# Patient Record
Sex: Male | Born: 1995 | Race: Black or African American | Hispanic: No | Marital: Single | Smoking: Never smoker
Health system: Southern US, Community
[De-identification: ages and names within clinical notes are randomized; demographics above are authoritative.]

---

## 2017-02-02 ENCOUNTER — Emergency Department (HOSPITAL_COMMUNITY): Payer: BLUE CROSS/BLUE SHIELD

## 2017-02-02 ENCOUNTER — Other Ambulatory Visit: Payer: Self-pay

## 2017-02-02 ENCOUNTER — Emergency Department (HOSPITAL_COMMUNITY)
Admission: EM | Admit: 2017-02-02 | Discharge: 2017-02-02 | Disposition: A | Discharge status: Home / Self Care | Payer: BLUE CROSS/BLUE SHIELD | Attending: Emergency Medicine | Admitting: Emergency Medicine

## 2017-02-02 ENCOUNTER — Encounter (HOSPITAL_COMMUNITY): Payer: Self-pay | Admitting: *Deleted

## 2017-02-02 DIAGNOSIS — Y92410 Unspecified street and highway as the place of occurrence of the external cause: Secondary | ICD-10-CM | POA: Diagnosis not present

## 2017-02-02 DIAGNOSIS — X19XXXA Contact with other heat and hot substances, initial encounter: Secondary | ICD-10-CM | POA: Insufficient documentation

## 2017-02-02 DIAGNOSIS — M25532 Pain in left wrist: Secondary | ICD-10-CM

## 2017-02-02 DIAGNOSIS — Y998 Other external cause status: Secondary | ICD-10-CM | POA: Insufficient documentation

## 2017-02-02 DIAGNOSIS — T300 Burn of unspecified body region, unspecified degree: Secondary | ICD-10-CM

## 2017-02-02 DIAGNOSIS — T23072A Burn of unspecified degree of left wrist, initial encounter: Secondary | ICD-10-CM | POA: Insufficient documentation

## 2017-02-02 DIAGNOSIS — Y9389 Activity, other specified: Secondary | ICD-10-CM | POA: Insufficient documentation

## 2017-02-02 MED ORDER — METHOCARBAMOL 500 MG PO TABS: 500.0000 mg | ORAL_TABLET | Freq: Two times a day (BID) | ORAL | 0 refills | Status: AC

## 2017-02-02 MED ORDER — NAPROXEN 500 MG PO TABS: 500.0000 mg | ORAL_TABLET | Freq: Two times a day (BID) | ORAL | 0 refills | Status: AC

## 2017-02-02 MED ORDER — IBUPROFEN 200 MG PO TABS
600.0000 mg | ORAL_TABLET | Freq: Once | ORAL | Status: AC
Start: 2017-02-02 — End: 2017-02-02
  Administered 2017-02-02: 600 mg via ORAL
  Filled 2017-02-02: qty 1
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
## 2017-02-02 NOTE — Discharge Instructions (Signed)
You will likely experience some muscle aches and pains tomorrow, you may take Naprosyn and Robaxin as needed for pain management. Do not combine with any pain reliever other than tylenol. The muscle soreness should improve over the next week.  Please apply Neosporin ointment to the burn on your left wrist, you may apply ice as needed.  Follow up with your family doctor in the next week for a recheck if you are still having symptoms. Return to ED if pain is worsening, you develop weakness or numbness of extremities, or increasing redness, swelling or pain around the left wrist or other new or concerning symptoms develop.

## 2017-02-02 NOTE — ED Notes (Signed)
Declined W/C at D/C and was escorted to lobby by RN. 

## 2017-02-02 NOTE — ED Notes (Signed)
Patient transported to X-ray 

## 2017-10-20 ENCOUNTER — Encounter (HOSPITAL_COMMUNITY): Payer: Self-pay | Admitting: Emergency Medicine

## 2017-10-20 ENCOUNTER — Emergency Department (HOSPITAL_COMMUNITY)
Admission: EM | Admit: 2017-10-20 | Discharge: 2017-10-20 | Disposition: A | Discharge status: Home / Self Care | Payer: BLUE CROSS/BLUE SHIELD | Attending: Emergency Medicine | Admitting: Emergency Medicine

## 2017-10-20 ENCOUNTER — Emergency Department (HOSPITAL_COMMUNITY): Payer: BLUE CROSS/BLUE SHIELD

## 2017-10-20 ENCOUNTER — Other Ambulatory Visit: Payer: Self-pay

## 2017-10-20 DIAGNOSIS — Y929 Unspecified place or not applicable: Secondary | ICD-10-CM | POA: Insufficient documentation

## 2017-10-20 DIAGNOSIS — X58XXXA Exposure to other specified factors, initial encounter: Secondary | ICD-10-CM | POA: Diagnosis not present

## 2017-10-20 DIAGNOSIS — F1290 Cannabis use, unspecified, uncomplicated: Secondary | ICD-10-CM | POA: Insufficient documentation

## 2017-10-20 DIAGNOSIS — S43004A Unspecified dislocation of right shoulder joint, initial encounter: Secondary | ICD-10-CM | POA: Diagnosis not present

## 2017-10-20 DIAGNOSIS — Y998 Other external cause status: Secondary | ICD-10-CM | POA: Diagnosis not present

## 2017-10-20 DIAGNOSIS — Y9389 Activity, other specified: Secondary | ICD-10-CM | POA: Insufficient documentation

## 2017-10-20 MED ORDER — PROPOFOL 10 MG/ML IV BOLUS: 0.5000 mg/kg | Freq: Once | INTRAVENOUS | Status: DC

## 2017-10-20 MED ORDER — PROPOFOL 10 MG/ML IV BOLUS
INTRAVENOUS | Status: AC | PRN
  Administered 2017-10-20: 37.4 mg via INTRAVENOUS
  Administered 2017-10-20: 74.8 mg via INTRAVENOUS
  Administered 2017-10-20: 37.4 mg via INTRAVENOUS

## 2017-10-20 MED ORDER — MORPHINE SULFATE (PF) 4 MG/ML IV SOLN: 4.0000 mg | Freq: Once | INTRAVENOUS | Status: DC

## 2017-10-20 MED ORDER — PROPOFOL 10 MG/ML IV BOLUS
INTRAVENOUS | Status: AC
  Filled 2017-10-20: qty 20

## 2017-10-20 NOTE — ED Notes (Signed)
Pt requesting DC. PA Jeraldine Loots made aware of pt's readiness for DC. Awaiting paperwork at this time

## 2017-10-20 NOTE — ED Triage Notes (Signed)
Patient reports right shoulder joint dislocation this evening , denies injury or fall , pt. stated it happened in the past with no injury .

## 2017-10-20 NOTE — ED Notes (Signed)
RRT at bedside for the procedure of conscious sedation for Right shoulder reduction. No complications noted. Patient tolerated procedure well. No respiratory compromise or adverse effects noted.

## 2017-10-20 NOTE — Discharge Instructions (Addendum)
Please take Ibuprofen (Advil, motrin) and Tylenol (acetaminophen) to relieve your pain.  You may take up to 600 MG (3 pills) of normal strength ibuprofen every 8 hours as needed.  In between doses of ibuprofen you make take tylenol, up to 1,000 mg (two extra strength pills).  Do not take more than 3,000 mg tylenol in a 24 hour period.  Please check all medication labels as many medications such as pain and cold medications may contain tylenol.  Do not drink alcohol while taking these medications.  Do not take other NSAID'S while taking ibuprofen (such as aleve or naproxen).  Please take ibuprofen with food to decrease stomach upset.  Your shoulder appears to be chronically unstable, meaning that it will continue to frequently dislocate.  I have given you follow-up with orthopedics as you most likely need surgery to keep your shoulder from recurrently dislocating.

## 2017-10-20 NOTE — ED Provider Notes (Signed)
[facility] EMERGENCY DEPARTMENT Provider Note   CSN: [id number] Arrival date & time: 10/20/17  0008     History   Chief Complaint Chief Complaint  Patient presents with  . Dislocation    R-shoulder    HPI Cody Duran is a 22 y.o. male who presents today for evaluation of recurrent right shoulder dislocation.  He reports that he was "wrestling" with a friend who is in the room and assist in providing history.  He denies striking his head or passing out.  He states that he has dislocated the shoulder 9 or 10 times, however has always been able to pop it back in on his own at home.  He says that he has been unable to do this today.  He thinks that it popped out at around midnight.  He denies any numbness or tingling in his right arm.  He is right-handed.  His last meal was at 1 PM, his last oral intake including water was approximately 10pm.   Patient admits to marijuana use, denies any other drugs or alcohol ingestion tonight.  HPI  History reviewed. No pertinent past medical history.  There are no active problems to display for this patient.   History reviewed. No pertinent surgical history.      Home Medications    Prior to Admission medications   Medication Sig Start Date End Date Taking? Authorizing Provider  methocarbamol (ROBAXIN) 500 MG tablet Take 1 tablet (500 mg total) by mouth 2 (two) times daily. Patient not taking: Reported on 10/20/2017 02/02/17   Dartha Lodge, PA-C  naproxen (NAPROSYN) 500 MG tablet Take 1 tablet (500 mg total) by mouth 2 (two) times daily. Patient not taking: Reported on 10/20/2017 02/02/17   Dartha Lodge, PA-C    Family History No family history on file.  Social History Social History   Tobacco Use  . Smoking status: Never Smoker  . Smokeless tobacco: Never Used  Substance Use Topics  . Alcohol use: No    Frequency: Never  . Drug use: Yes    Types: Marijuana     Allergies   Patient has no known  allergies.   Review of Systems Review of Systems  Constitutional: Negative for chills and fever.  Gastrointestinal: Negative for nausea and vomiting.  Musculoskeletal:       Right shoulder pain  All other systems reviewed and are negative.    Physical Exam Updated Vital Signs BP 105/63 (BP Location: Left Arm)   Pulse 66   Temp 98.4 F (36.9 C) (Oral)   Resp 18   Ht 6\' 1"  (1.854 m)   Wt 74.8 kg Comment: from Feb 2019 records  SpO2 98%   BMI 21.77 kg/m   Physical Exam  Constitutional: He appears well-developed and well-nourished.  HENT:  Head: Normocephalic and atraumatic.  Mouth/Throat: Oropharynx is clear and moist.  Eyes: Conjunctivae are normal.  Neck: Normal range of motion. Neck supple.  Cardiovascular: Normal rate, regular rhythm, normal heart sounds and intact distal pulses.  No murmur heard. 2+ right radial pulse  Pulmonary/Chest: Effort normal and breath sounds normal. No respiratory distress.  Abdominal: Soft. He exhibits no distension. There is no tenderness. There is no guarding.  Musculoskeletal: He exhibits tenderness and deformity. He exhibits no edema.  Right shoulder is obviously deformed, NO TTP over mid/distal humerus, elbow, wrist or hand.    Neurological: He is alert.  Skin: Skin is warm and dry.  Psychiatric: He has a normal mood  and affect. His behavior is normal.  Nursing note and vitals reviewed.    ED Treatments / Results  Labs (all labs ordered are listed, but only abnormal results are displayed) Labs Reviewed - No data to display  EKG None  Radiology Dg Shoulder Right  Result Date: 10/20/2017 CLINICAL DATA:  Postreduction right shoulder EXAM: RIGHT SHOULDER - 2+ VIEW COMPARISON:  10/20/2017 FINDINGS: Normal anatomic position of the right shoulder postreduction. Interval reduction of previous anterior dislocation. No acute fractures identified. Soft tissues are unremarkable. IMPRESSION: Normal position of the right shoulder  postreduction. Electronically Signed   By: Burman Nieves M.D.   On: 10/20/2017 02:54   Dg Shoulder Right  Result Date: 10/20/2017 CLINICAL DATA:  Right shoulder pain starting tonight.  Deformity. EXAM: RIGHT SHOULDER - 2+ VIEW COMPARISON:  None. FINDINGS: There is anterior and inferior dislocation of the right humeral head with respect to the glenoid. No obvious bony Bankart or definite Hill-Sachs impaction currently identified. IMPRESSION: 1. Anterior inferior dislocation of the right humeral head with respect to the glenoid. Electronically Signed   By: Gaylyn Rong M.D.   On: 10/20/2017 00:51    Procedures .Ortho Injury Treatment Date/Time: 10/20/2017 1:52 AM Performed by: Cristina Gong, PA-C Authorized by: Cristina Gong, PA-C   Consent:    Consent obtained:  Verbal and written   Consent given by:  Patient   Risks discussed:  Fracture, irreducible dislocation, recurrent dislocation, nerve damage, restricted joint movement, stiffness and vascular damage   Alternatives discussed:  No treatment, immobilization and referralInjury location: shoulder Location details: right shoulder Injury type: dislocation Dislocation type: anterior Hill-Sachs deformity: no Chronicity: recurrent Pre-procedure neurovascular assessment: neurovascularly intact Pre-procedure distal perfusion: normal Pre-procedure neurological function: normal Pre-procedure range of motion: reduced  Anesthesia: Local anesthesia used: no  Patient sedated: Yes. Refer to sedation procedure documentation for details of sedation. Manipulation performed: yes Reduction successful: yes X-ray confirmed reduction: yes Immobilization: sling Post-procedure neurovascular assessment: post-procedure neurovascularly intact    (including critical care time)  Medications Ordered in ED Medications  propofol (DIPRIVAN) 10 mg/mL bolus/IV push 37.4 mg (0 mg/kg  74.8 kg Intravenous Hold 10/20/17 0237)  morphine 4  MG/ML injection 4 mg (0 mg Intravenous Hold 10/20/17 0236)  propofol (DIPRIVAN) 10 mg/mL bolus/IV push (37.4 mg Intravenous Given 10/20/17 0206)     Initial Impression / Assessment and Plan / ED Course  I have reviewed the triage vital signs and the nursing notes.  Pertinent labs & imaging results that were available during my care of the patient were reviewed by me and considered in my medical decision making (see chart for details).    Patient presents today for evaluation of sudden right shoulder pain that occurred while he was "wrestling" prior to arrival.  X-rays were obtained showing a dislocated right shoulder.  Discussed with patient treatment options.  Initially he elected for gentle manipulation without sedation which was unsuccessful in reducing.  He then elected for sedation with reduction.  Reduction was performed using traction and direct pressure.  Patient's shoulder was reduced, stayed in joint when kept still.  Shoulder immobilizer was placed.  Sedation was without obvious immediate complications.  Reduction confirmed with x-ray.  He was given post sedation instructions with his discharge papers.  Discussed motion restrictions.  He was given ortho follow up due to recurrent nature of his dislocations.     Final Clinical Impressions(s) / ED Diagnoses   Final diagnoses:  Dislocation of right shoulder joint, initial encounter  ED Discharge Orders    None       Cristina Gong, [place] 10/20/17 1610    Ward, Layla Maw, DO 10/20/17 0500

## 2017-10-20 NOTE — ED Notes (Signed)
Patient verbalizes understanding of discharge instructions. Opportunity for questioning and answers were provided. Armband removed by staff, pt discharged from ED ambulatory.   

## 2017-10-20 NOTE — ED Provider Notes (Signed)
Medical screening examination/treatment/procedure(s) were conducted as a shared visit with non-physician practitioner(s) and myself.  I personally evaluated the patient during the encounter.  None   Patient is a 22 year old right-hand-dominant male who presents to the emergency department with anterior inferior dislocation of the right shoulder.  N.p.o. since 10 PM.  No allergies.  No medical history.  Sedated using propofol.  Reduction performed by Lyndel Safe, PA.  Please see her note for further information.  Placed in immobilizer and will give orthopedic follow-up.  He has dislocated this shoulder several times in the past.   .Sedation Date/Time: 10/20/2017 2:39 AM Performed by: Kellianne Ek, Layla Maw, DO Authorized by: Wynema Garoutte, Layla Maw, DO   Consent:    Consent obtained:  Written   Consent given by:  Patient   Risks discussed:  Allergic reaction, dysrhythmia, inadequate sedation, nausea, prolonged hypoxia resulting in organ damage, prolonged sedation necessitating reversal, respiratory compromise necessitating ventilatory assistance and intubation and vomiting   Alternatives discussed:  Analgesia without sedation, anxiolysis and regional anesthesia Universal protocol:    Procedure explained and questions answered to patient or proxy's satisfaction: yes     Relevant documents present and verified: yes     Test results available and properly labeled: yes     Imaging studies available: yes     Required blood products, implants, devices, and special equipment available: yes     Site/side marked: yes     Immediately prior to procedure a time out was called: yes     Patient identity confirmation method:  Verbally with patient Indications:    Procedure necessitating sedation performed by:  Physician performing sedation Pre-sedation assessment:    Time since last food or drink:  10 pm 10/19/17   ASA classification: class 1 - normal, healthy patient     Neck mobility: normal     Mouth  opening:  3 or more finger widths   Thyromental distance:  4 finger widths   Mallampati score:  I - soft palate, uvula, fauces, pillars visible   Pre-sedation assessments completed and reviewed: airway patency, cardiovascular function, hydration status, mental status, nausea/vomiting, pain level, respiratory function and temperature     Pre-sedation assessment completed:  10/20/2017 2:00 AM Immediate pre-procedure details:    Reassessment: Patient reassessed immediately prior to procedure     Reviewed: vital signs, relevant labs/tests and NPO status     Verified: bag valve mask available, emergency equipment available, intubation equipment available, IV patency confirmed, oxygen available and suction available   Procedure details (see MAR for exact dosages):    Preoxygenation:  Nasal cannula   Sedation:  Propofol   Intra-procedure monitoring:  Blood pressure monitoring, cardiac monitor, continuous pulse oximetry, frequent LOC assessments, frequent vital sign checks and continuous capnometry   Intra-procedure events: none     Total Provider sedation time (minutes):  15 Post-procedure details:    Post-sedation assessment completed:  10/20/2017 2:40 AM   Attendance: Constant attendance by certified staff until patient recovered     Recovery: Patient returned to pre-procedure baseline     Post-sedation assessments completed and reviewed: airway patency, cardiovascular function, hydration status, mental status, nausea/vomiting, pain level, respiratory function and temperature     Patient is stable for discharge or admission: yes     Patient tolerance:  Tolerated well, no immediate complications      Kazi Montoro, Layla Maw, DO 10/20/17 0240

## 2019-03-21 IMAGING — DX DG SHOULDER 2+V*R*
3 series · 3 of 3 positions shown · non-contrast
Comparison: None.

CLINICAL DATA: Right shoulder pain starting tonight.  Deformity.

EXAM:
RIGHT SHOULDER - 2+ VIEW

[shoulder grashey]
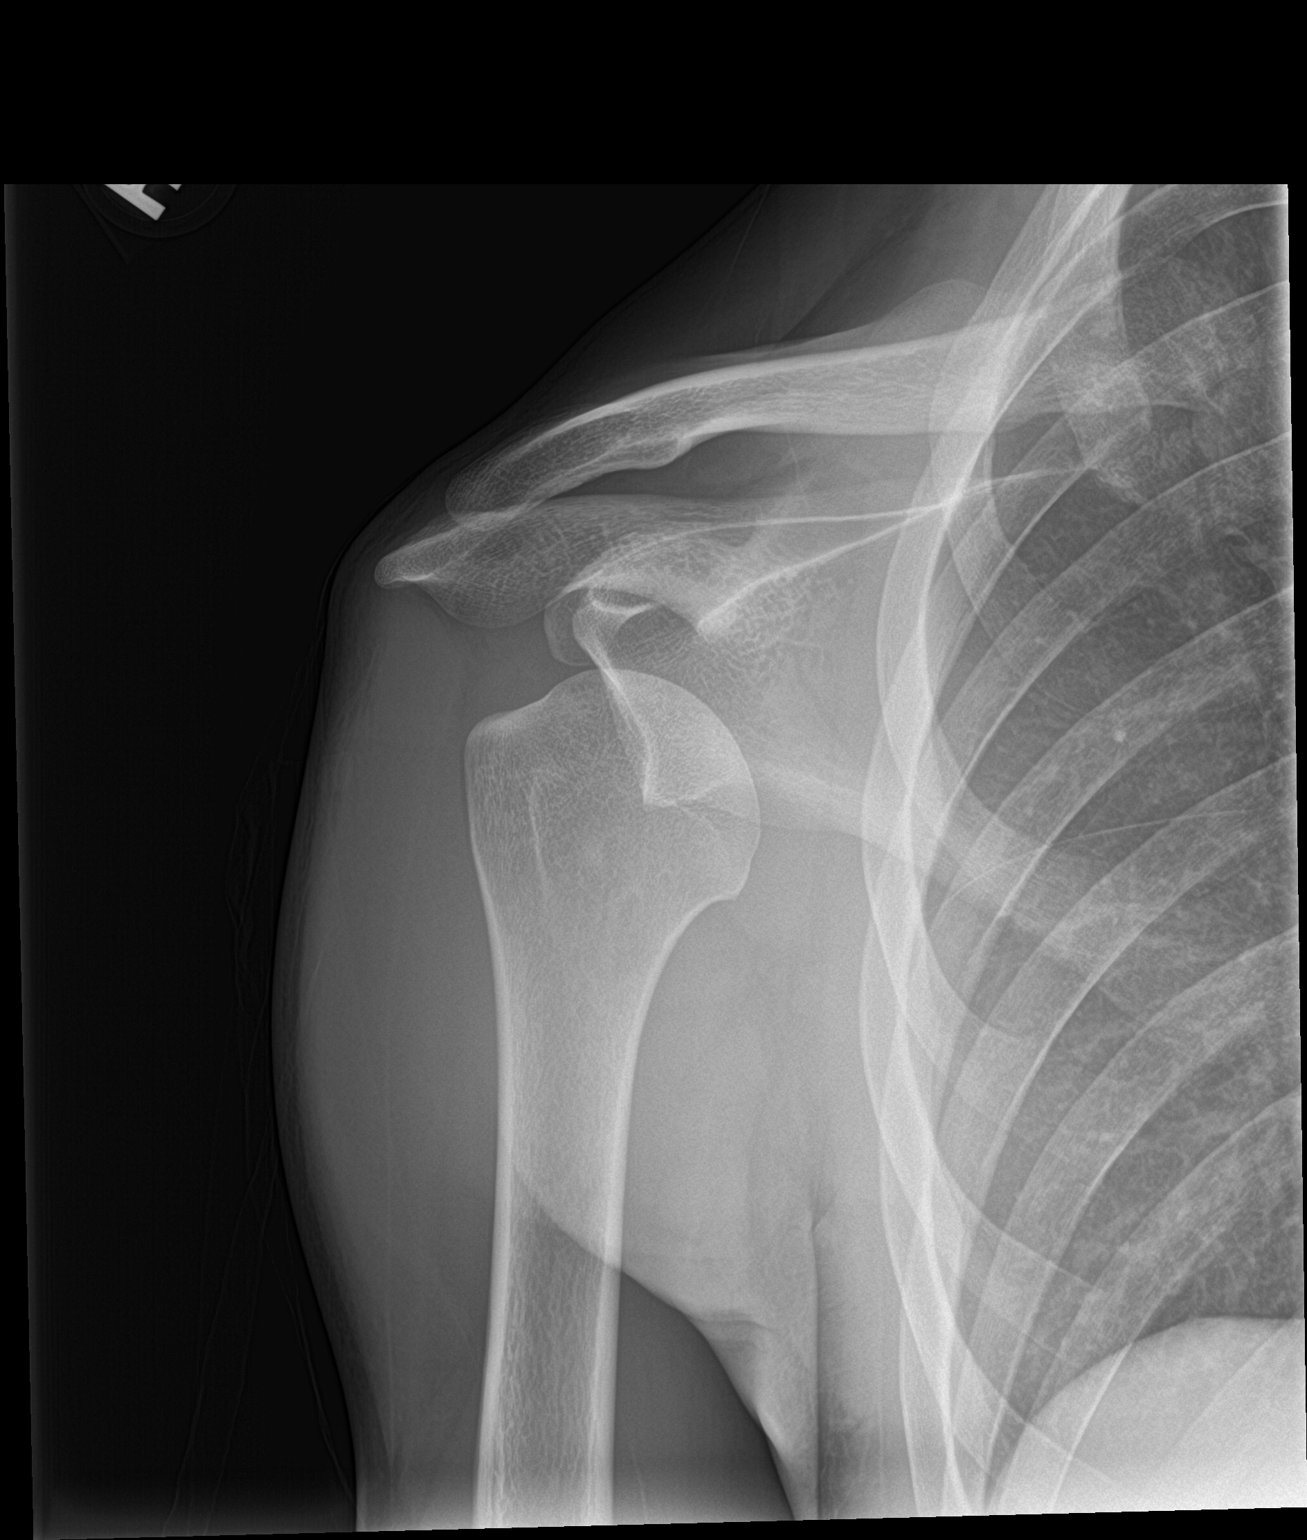

[shoulder y view (1 of 2)]
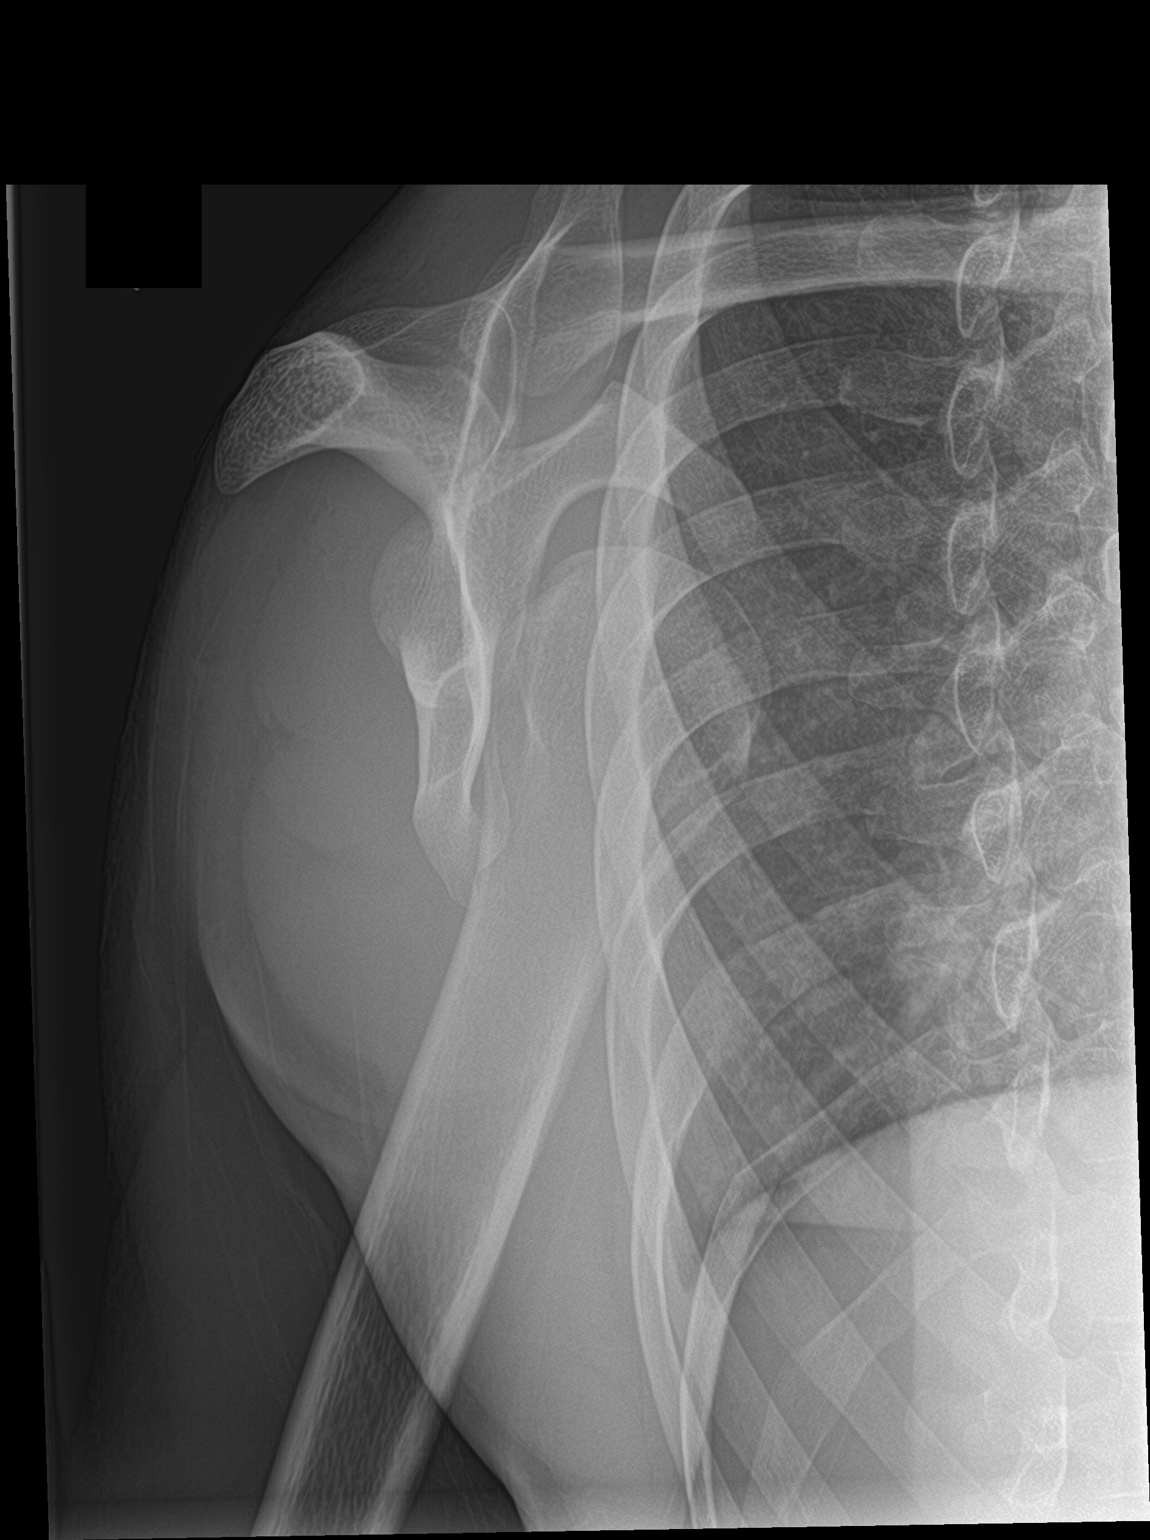

[shoulder y view (2 of 2)]
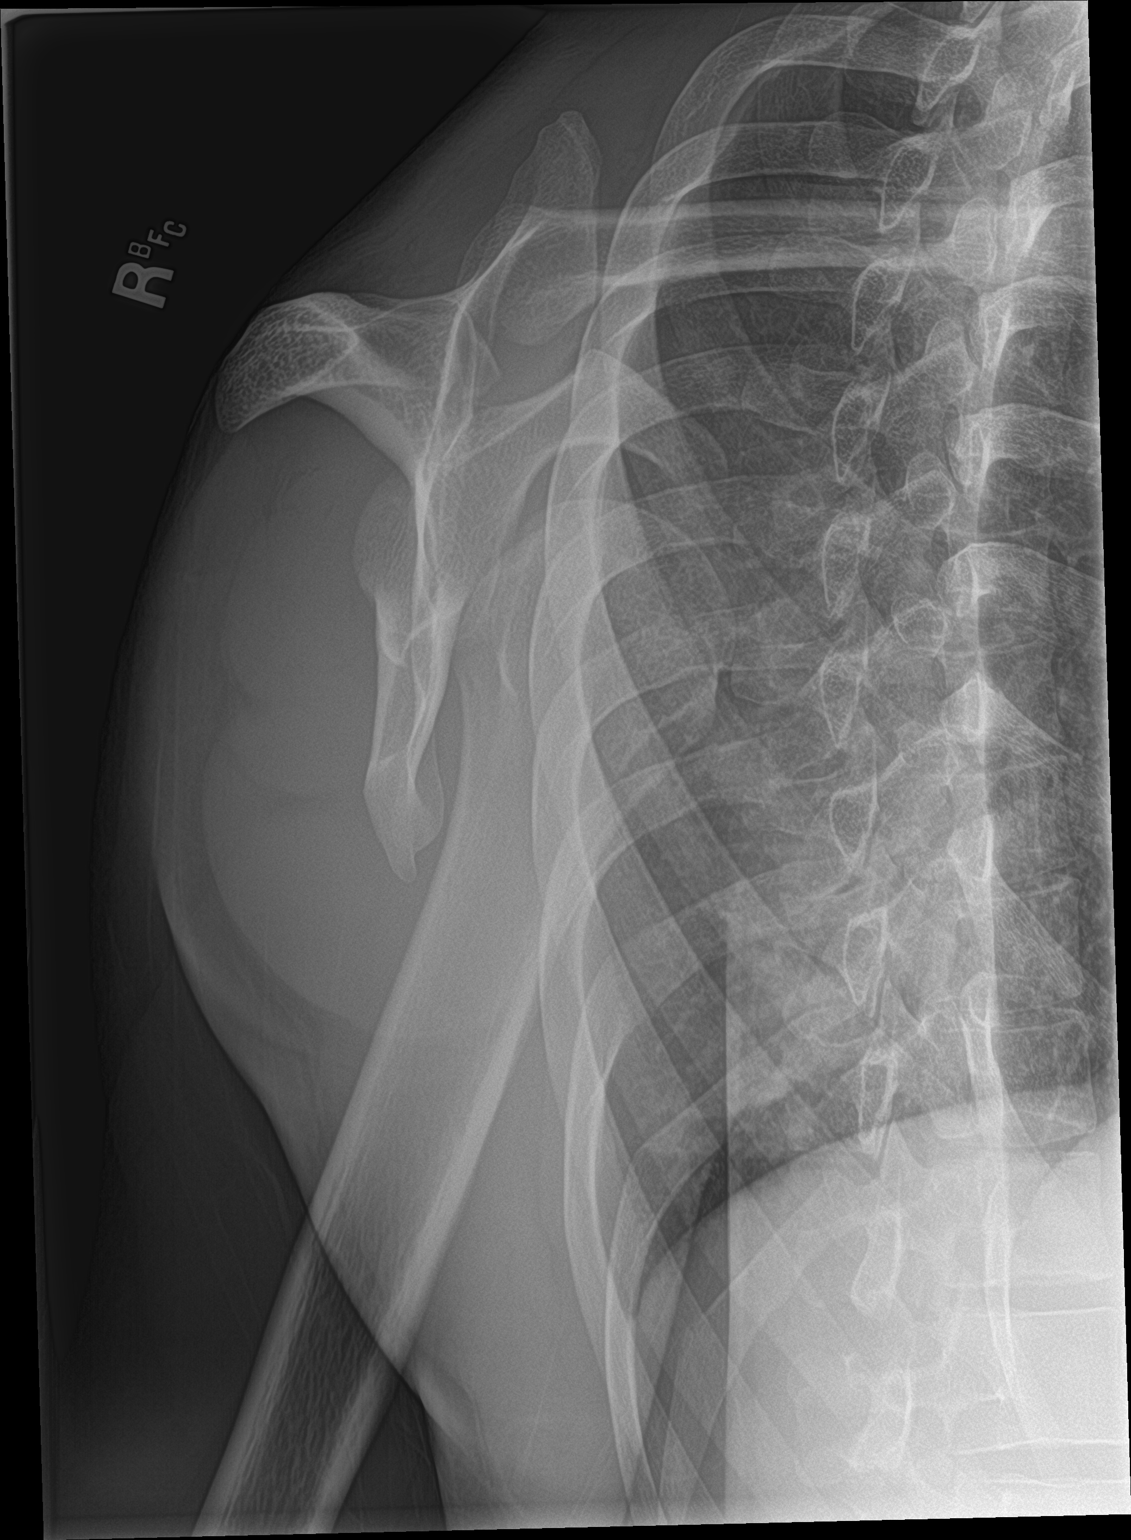

[3 of 3 positions shown; findings below may reference images not displayed]

FINDINGS: There is anterior and inferior dislocation of the right humeral head
with respect to the glenoid. No obvious bony Bankart or definite
Hill-Sachs impaction currently identified.
IMPRESSION: 1. Anterior inferior dislocation of the right humeral head with
respect to the glenoid.
